# Patient Record
Sex: Male | Born: 1990 | Race: Black or African American | Hispanic: No | Marital: Single | Smoking: Current every day smoker
Health system: Southern US, Community
[De-identification: ages and names within clinical notes are randomized; demographics above are authoritative.]

---
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
## 2015-01-16 ENCOUNTER — Emergency Department (HOSPITAL_BASED_OUTPATIENT_CLINIC_OR_DEPARTMENT_OTHER)
Admission: EM | Admit: 2015-01-16 | Discharge: 2015-01-16 | Disposition: A | Discharge status: Home / Self Care | Payer: Self-pay | Attending: Emergency Medicine | Admitting: Emergency Medicine

## 2015-01-16 ENCOUNTER — Encounter (HOSPITAL_BASED_OUTPATIENT_CLINIC_OR_DEPARTMENT_OTHER): Payer: Self-pay | Admitting: *Deleted

## 2015-01-16 DIAGNOSIS — Z202 Contact with and (suspected) exposure to infections with a predominantly sexual mode of transmission: Secondary | ICD-10-CM | POA: Insufficient documentation

## 2015-01-16 DIAGNOSIS — R1030 Lower abdominal pain, unspecified: Secondary | ICD-10-CM

## 2015-01-16 DIAGNOSIS — Z711 Person with feared health complaint in whom no diagnosis is made: Secondary | ICD-10-CM

## 2015-01-16 DIAGNOSIS — R112 Nausea with vomiting, unspecified: Secondary | ICD-10-CM | POA: Insufficient documentation

## 2015-01-16 DIAGNOSIS — Z720 Tobacco use: Secondary | ICD-10-CM | POA: Insufficient documentation

## 2015-01-16 DIAGNOSIS — R300 Dysuria: Secondary | ICD-10-CM | POA: Insufficient documentation

## 2015-01-16 LAB — URINALYSIS, ROUTINE W REFLEX MICROSCOPIC
BILIRUBIN URINE: NEGATIVE
GLUCOSE, UA: NEGATIVE mg/dL
Hgb urine dipstick: NEGATIVE
KETONES UR: NEGATIVE mg/dL
LEUKOCYTES UA: NEGATIVE
Nitrite: NEGATIVE
PH: 6 (ref 5.0–8.0)
PROTEIN: NEGATIVE mg/dL
Specific Gravity, Urine: 1.022 (ref 1.005–1.030)
UROBILINOGEN UA: 0.2 mg/dL (ref 0.0–1.0)

## 2015-01-16 MED ORDER — CEFTRIAXONE SODIUM 250 MG IJ SOLR
250.0000 mg | Freq: Once | INTRAMUSCULAR | Status: AC
  Administered 2015-01-16: 250 mg via INTRAMUSCULAR
  Filled 2015-01-16: qty 250

## 2015-01-16 MED ORDER — AZITHROMYCIN 250 MG PO TABS
1000.0000 mg | ORAL_TABLET | Freq: Once | ORAL | Status: AC
  Administered 2015-01-16: 1000 mg via ORAL
  Filled 2015-01-16: qty 4

## 2015-01-16 NOTE — Discharge Instructions (Signed)
You were treated today for both gonorrhea and chlamydia. If these tests result positive, you will be contacted and are then obligated to inform your partner for treatment.  Abdominal Pain Many things can cause abdominal pain. Usually, abdominal pain is not caused by a disease and will improve without treatment. It can often be observed and treated at home. Your health care provider will do a physical exam and possibly order blood tests and X-rays to help determine the seriousness of your pain. However, in many cases, more time must pass before a clear cause of the pain can be found. Before that point, your health care provider may not know if you need more testing or further treatment. HOME CARE INSTRUCTIONS  Monitor your abdominal pain for any changes. The following actions may help to alleviate any discomfort you are experiencing:  Only take over-the-counter or prescription medicines as directed by your health care provider.  Do not take laxatives unless directed to do so by your health care provider.  Try a clear liquid diet (broth, tea, or water) as directed by your health care provider. Slowly move to a bland diet as tolerated. SEEK MEDICAL CARE IF:  You have unexplained abdominal pain.  You have abdominal pain associated with nausea or diarrhea.  You have pain when you urinate or have a bowel movement.  You experience abdominal pain that wakes you in the night.  You have abdominal pain that is worsened or improved by eating food.  You have abdominal pain that is worsened with eating fatty foods.  You have a fever. SEEK IMMEDIATE MEDICAL CARE IF:   Your pain does not go away within 2 hours.  You keep throwing up (vomiting).  Your pain is felt only in portions of the abdomen, such as the right side or the left lower portion of the abdomen.  You pass bloody or black tarry stools. MAKE SURE YOU:  Understand these instructions.   Will watch your condition.   Will get help  right away if you are not doing well or get worse.  Document Released: 03/20/2005 Document Revised: 06/15/2013 Document Reviewed: 02/17/2013 [facility] Patient Information 2015 [place], [place]. This information is not intended to replace advice given to you by your health care provider. Make sure you discuss any questions you have with your health care provider.  Dysuria Dysuria is the medical term for pain with urination. There are many causes for dysuria, but urinary tract infection is the most common. If a urinalysis was performed it can show that there is a urinary tract infection. A urine culture confirms that you or your child is sick. You will need to follow up with a healthcare provider because:  If a urine culture was done you will need to know the culture results and treatment recommendations.  If the urine culture was positive, you or your child will need to be put on antibiotics or know if the antibiotics prescribed are the right antibiotics for your urinary tract infection.  If the urine culture is negative (no urinary tract infection), then other causes may need to be explored or antibiotics need to be stopped. Today laboratory work may have been done and there does not seem to be an infection. If cultures were done they will take at least 24 to 48 hours to be completed. Today x-rays may have been taken and they read as normal. No cause can be found for the problems. The x-rays may be re-read by a radiologist and you will be  contacted if additional findings are made. You or your child may have been put on medications to help with this problem until you can see your primary caregiver. If the problems get better, see your primary caregiver if the problems return. If you were given antibiotics (medications which kill germs), take all of the mediations as directed for the full course of treatment.  If laboratory work was done, you need to find the results. Leave a telephone number where you  can be reached. If this is not possible, make sure you find out how you are to get test results. HOME CARE INSTRUCTIONS   Drink lots of fluids. For adults, drink eight, 8 ounce glasses of clear juice or water a day. For children, replace fluids as suggested by your caregiver.  Empty the bladder often. Avoid holding urine for long periods of time.  After a bowel movement, women should cleanse front to back, using each tissue only once.  Empty your bladder before and after sexual intercourse.  Take all the medicine given to you until it is gone. You may feel better in a few days, but TAKE ALL MEDICINE.  Avoid caffeine, tea, alcohol and carbonated beverages, because they tend to irritate the bladder.  In men, alcohol may irritate the prostate.  Only take over-the-counter or prescription medicines for pain, discomfort, or fever as directed by your caregiver.  If your caregiver has given you a follow-up appointment, it is very important to keep that appointment. Not keeping the appointment could result in a chronic or permanent injury, pain, and disability. If there is any problem keeping the appointment, you must call back to this facility for assistance. SEEK IMMEDIATE MEDICAL CARE IF:   Back pain develops.  A fever develops.  There is nausea (feeling sick to your stomach) or vomiting (throwing up).  Problems are no better with medications or are getting worse. MAKE SURE YOU:   Understand these instructions.  Will watch your condition.  Will get help right away if you are not doing well or get worse. Document Released: 03/08/2004 Document Revised: 09/02/2011 Document Reviewed: 01/14/2008 [facility] Patient Information 2015 [place], [place]. This information is not intended to replace advice given to you by your health care provider. Make sure you discuss any questions you have with your health care provider. Sexually Transmitted Disease A sexually transmitted disease (STD) is a  disease or infection that may be passed (transmitted) from person to person, usually during sexual activity. This may happen by way of saliva, semen, blood, vaginal mucus, or urine. Common STDs include:   Gonorrhea.   Chlamydia.   Syphilis.   HIV and AIDS.   Genital herpes.   Hepatitis B and C.   Trichomonas.   Human papillomavirus (HPV).   Pubic lice.   Scabies.  Mites.  Bacterial vaginosis. WHAT ARE CAUSES OF STDs? An STD may be caused by bacteria, a virus, or parasites. STDs are often transmitted during sexual activity if one person is infected. However, they may also be transmitted through nonsexual means. STDs may be transmitted after:   Sexual intercourse with an infected person.   Sharing sex toys with an infected person.   Sharing needles with an infected person or using unclean piercing or tattoo needles.  Having intimate contact with the genitals, mouth, or rectal areas of an infected person.   Exposure to infected fluids during birth. WHAT ARE THE SIGNS AND SYMPTOMS OF STDs? Different STDs have different symptoms. Some people may not have any symptoms.  If symptoms are present, they may include:   Painful or bloody urination.   Pain in the pelvis, abdomen, vagina, anus, throat, or eyes.   A skin rash, itching, or irritation.  Growths, ulcerations, blisters, or sores in the genital and anal areas.  Abnormal vaginal discharge with or without bad odor.   Penile discharge in men.   Fever.   Pain or bleeding during sexual intercourse.   Swollen glands in the groin area.   Yellow skin and eyes (jaundice). This is seen with hepatitis.   Swollen testicles.  Infertility.  Sores and blisters in the mouth. HOW ARE STDs DIAGNOSED? To make a diagnosis, your health care provider may:   Take a medical history.   Perform a physical exam.   Take a sample of any discharge to examine.  Swab the throat, cervix, opening to the  penis, rectum, or vagina for testing.  Test a sample of your first morning urine.   Perform blood tests.   Perform a Pap test, if this applies.   Perform a colposcopy.   Perform a laparoscopy.  HOW ARE STDs TREATED? Treatment depends on the STD. Some STDs may be treated but not cured.   Chlamydia, gonorrhea, trichomonas, and syphilis can be cured with antibiotic medicine.   Genital herpes, hepatitis, and HIV can be treated, but not cured, with prescribed medicines. The medicines lessen symptoms.   Genital warts from HPV can be treated with medicine or by freezing, burning (electrocautery), or surgery. Warts may come back.   HPV cannot be cured with medicine or surgery. However, abnormal areas may be removed from the cervix, vagina, or vulva.   If your diagnosis is confirmed, your recent sexual partners need treatment. This is true even if they are symptom-free or have a negative culture or evaluation. They should not have sex until their health care providers say it is okay. HOW CAN I REDUCE MY RISK OF GETTING AN STD? Take these steps to reduce your risk of getting an STD:  Use latex condoms, dental dams, and water-soluble lubricants during sexual activity. Do not use petroleum jelly or oils.  Avoid having multiple sex partners.  Do not have sex with someone who has other sex partners.  Do not have sex with anyone you do not know or who is at high risk for an STD.  Avoid risky sex practices that can break your skin.  Do not have sex if you have open sores on your mouth or skin.  Avoid drinking too much alcohol or taking illegal drugs. Alcohol and drugs can affect your judgment and put you in a vulnerable position.  Avoid engaging in oral and anal sex acts.  Get vaccinated for HPV and hepatitis. If you have not received these vaccines in the past, talk to your health care provider about whether one or both might be right for you.   If you are at risk of being  infected with HIV, it is recommended that you take a prescription medicine daily to prevent HIV infection. This is called pre-exposure prophylaxis (PrEP). You are considered at risk if:  You are a man who has sex with other men (MSM).  You are a heterosexual man or woman and are sexually active with more than one partner.  You take drugs by injection.  You are sexually active with a partner who has HIV.  Talk with your health care provider about whether you are at high risk of being infected with HIV. If you choose  to begin PrEP, you should first be tested for HIV. You should then be tested every 3 months for as long as you are taking PrEP.  WHAT SHOULD I DO IF I THINK I HAVE AN STD?  See your health care provider.   Tell your sexual partner(s). They should be tested and treated for any STDs.  Do not have sex until your health care provider says it is okay. WHEN SHOULD I GET IMMEDIATE MEDICAL CARE? Contact your health care provider right away if:   You have severe abdominal pain.  You are a man and notice swelling or pain in your testicles.  You are a woman and notice swelling or pain in your vagina. Document Released: 08/31/2002 Document Revised: 06/15/2013 Document Reviewed: 12/29/2012 [facility] Patient Information 2015 [place], [place]. This information is not intended to replace advice given to you by your health care provider. Make sure you discuss any questions you have with your health care provider.

## 2015-01-16 NOTE — ED Notes (Signed)
Not in ED WR when called for tx area 

## 2015-01-16 NOTE — ED Provider Notes (Signed)
CSN: [id number]     Arrival date & time 01/16/15  1531 History  This chart was scribed for non-physician practitioner, Kathrynn Speed, PA-C working with Blake Divine, MD by Placido Sou, ED scribe. This patient was seen in room MH01/MH01 and the patient's care was started at 6:21 PM.  Chief Complaint  Patient presents with  . Abdominal Pain   The history is provided by the patient. No language interpreter was used.    HPI Comments: Robert Morton is a 24 y.o. male who presents to the Emergency Department complaining of constant, moderate, lower abd pain with onset 6 days ago. He notes a worsening of his abd pain s/p urinating, describes the pain as sharp, notes associated dysuria and nausea and further notes nothing alleviates his pain. Pt notes consuming 3-4 alcoholic beverages per day and further notes having a new sexual partner about 6 days ago and using protection during intercourse. Pt denies recent travel, dietary changes, fever, chills, penile discharge and vomiting. He is concerned for STDs. No diarrhea or constipation.  History reviewed. No pertinent past medical history. History reviewed. No pertinent past surgical history. No family history on file. History  Substance Use Topics  . Smoking status: Current Every Day Smoker  . Smokeless tobacco: Not on file  . Alcohol Use: Yes    Review of Systems  Constitutional: Negative for fever and chills.  Gastrointestinal: Positive for nausea and abdominal pain. Negative for vomiting.  Genitourinary: Positive for dysuria. Negative for discharge.  All other systems reviewed and are negative.   Allergies  Review of patient's allergies indicates no known allergies.  Home Medications   Prior to Admission medications   Not on File   BP 113/72 mmHg  Pulse 74  Temp(Src) 98 F (36.7 C) (Oral)  Resp 18  Ht 6' (1.829 m)  Wt 136 lb (61.689 kg)  BMI 18.44 kg/m2  SpO2 100% Physical Exam  Constitutional: He is oriented to person,  place, and time. He appears well-developed and well-nourished. No distress.  HENT:  Head: Normocephalic and atraumatic.  Eyes: Conjunctivae and EOM are normal.  Neck: Normal range of motion. Neck supple.  Cardiovascular: Normal rate, regular rhythm and normal heart sounds.   Pulmonary/Chest: Effort normal and breath sounds normal.  Abdominal: Soft. Bowel sounds are normal. He exhibits no distension. There is no tenderness.  No CVAT.  Genitourinary: Right testis shows no mass, no swelling and no tenderness. Cremasteric reflex is not absent on the right side. Left testis shows no mass, no swelling and no tenderness. Cremasteric reflex is not absent on the left side. Circumcised. No penile erythema or penile tenderness. No discharge found.  Musculoskeletal: Normal range of motion. He exhibits no edema.  Neurological: He is alert and oriented to person, place, and time.  Skin: Skin is warm and dry.  Psychiatric: He has a normal mood and affect. His behavior is normal.  Nursing note and vitals reviewed.   ED Course  Procedures  DIAGNOSTIC STUDIES: Oxygen Saturation is 100% on RA, normal by my interpretation.    COORDINATION OF CARE: 6:27 PM Discussed treatment plan with pt at bedside 100 and pt agreed to plan.  Labs Review Labs Reviewed  URINALYSIS, ROUTINE W REFLEX MICROSCOPIC (NOT AT [facility])  GC/CHLAMYDIA PROBE AMP ([place]) NOT AT [facility]    Imaging Review No results found.   EKG Interpretation None     MDM   Final diagnoses:  Dysuria  Concern about STD in male without diagnosis  Lower abdominal pain   Nontoxic appearing, NAD. AF VSS. Abdomen soft and nontender. No CVA tenderness. UA negative. GC/Chlamydia cultures pending. Prophylactically treated with Rocephin and azithromycin. States sexual practices discussed. Has nausea without vomiting. Tolerating PO. Stable for discharge. Return precautions given. Patient states understanding of treatment care plan and is  agreeable.  I personally performed the services described in this documentation, which was scribed in my presence. The recorded information has been reviewed and is accurate.  Kathrynn Speed, PA-C 01/16/15 1845  Blake Divine, MD 01/18/15 [phone]

## 2015-01-16 NOTE — ED Notes (Signed)
Pt returned to ED WR and now is not in ED WR again when called for tx area

## 2015-01-16 NOTE — ED Notes (Signed)
pa at bedside. 

## 2015-01-16 NOTE — ED Notes (Signed)
Pt c/o lower abd pressure and burning with urination that began 5 days ago after having unprotected sex.

## 2015-01-17 LAB — GC/CHLAMYDIA PROBE AMP (~~LOC~~) NOT AT ARMC
Chlamydia: NEGATIVE
Neisseria Gonorrhea: NEGATIVE

## 2015-04-10 ENCOUNTER — Encounter (HOSPITAL_BASED_OUTPATIENT_CLINIC_OR_DEPARTMENT_OTHER): Payer: Self-pay | Admitting: *Deleted

## 2015-04-10 ENCOUNTER — Emergency Department (HOSPITAL_BASED_OUTPATIENT_CLINIC_OR_DEPARTMENT_OTHER)
Admission: EM | Admit: 2015-04-10 | Discharge: 2015-04-10 | Disposition: A | Discharge status: Home / Self Care | Payer: Self-pay | Attending: Emergency Medicine | Admitting: Emergency Medicine

## 2015-04-10 DIAGNOSIS — A64 Unspecified sexually transmitted disease: Secondary | ICD-10-CM | POA: Insufficient documentation

## 2015-04-10 DIAGNOSIS — Z720 Tobacco use: Secondary | ICD-10-CM | POA: Insufficient documentation

## 2015-04-10 MED ORDER — AZITHROMYCIN 250 MG PO TABS
1000.0000 mg | ORAL_TABLET | Freq: Once | ORAL | Status: AC
  Administered 2015-04-10: 1000 mg via ORAL
  Filled 2015-04-10: qty 4

## 2015-04-10 MED ORDER — CEFTRIAXONE SODIUM 250 MG IJ SOLR
250.0000 mg | Freq: Once | INTRAMUSCULAR | Status: AC
  Administered 2015-04-10: 250 mg via INTRAMUSCULAR
  Filled 2015-04-10: qty 250

## 2015-04-10 NOTE — Discharge Instructions (Signed)
Sexually Transmitted Disease °A sexually transmitted disease (STD) is a disease or infection that may be passed (transmitted) from person to person, usually during sexual activity. This may happen by way of saliva, semen, blood, vaginal mucus, or urine. Common STDs include: °· Gonorrhea. °· Chlamydia. °· Syphilis. °· HIV and AIDS. °· Genital herpes. °· Hepatitis B and C. °· Trichomonas. °· Human papillomavirus (HPV). °· Pubic lice. °· Scabies. °· Mites. °· Bacterial vaginosis. °WHAT ARE CAUSES OF STDs? °An STD may be caused by bacteria, a virus, or parasites. STDs are often transmitted during sexual activity if one person is infected. However, they may also be transmitted through nonsexual means. STDs may be transmitted after:  °· Sexual intercourse with an infected person. °· Sharing sex toys with an infected person. °· Sharing needles with an infected person or using unclean piercing or tattoo needles. °· Having intimate contact with the genitals, mouth, or rectal areas of an infected person. °· Exposure to infected fluids during birth. °WHAT ARE THE SIGNS AND SYMPTOMS OF STDs? °Different STDs have different symptoms. Some people may not have any symptoms. If symptoms are present, they may include: °· Painful or bloody urination. °· Pain in the pelvis, abdomen, vagina, anus, throat, or eyes. °· A skin rash, itching, or irritation. °· Growths, ulcerations, blisters, or sores in the genital and anal areas. °· Abnormal vaginal discharge with or without bad odor. °· Penile discharge in men. °· Fever. °· Pain or bleeding during sexual intercourse. °· Swollen glands in the groin area. °· Yellow skin and eyes (jaundice). This is seen with hepatitis. °· Swollen testicles. °· Infertility. °· Sores and blisters in the mouth. °HOW ARE STDs DIAGNOSED? °To make a diagnosis, your health care provider may: °· Take a medical history. °· Perform a physical exam. °· Take a sample of any discharge to examine. °· Swab the throat,  cervix, opening to the penis, rectum, or vagina for testing. °· Test a sample of your first morning urine. °· Perform blood tests. °· Perform a Pap test, if this applies. °· Perform a colposcopy. °· Perform a laparoscopy. °HOW ARE STDs TREATED? °Treatment depends on the STD. Some STDs may be treated but not cured. °· Chlamydia, gonorrhea, trichomonas, and syphilis can be cured with antibiotic medicine. °· Genital herpes, hepatitis, and HIV can be treated, but not cured, with prescribed medicines. The medicines lessen symptoms. °· Genital warts from HPV can be treated with medicine or by freezing, burning (electrocautery), or surgery. Warts may come back. °· HPV cannot be cured with medicine or surgery. However, abnormal areas may be removed from the cervix, vagina, or vulva. °· If your diagnosis is confirmed, your recent sexual partners need treatment. This is true even if they are symptom-free or have a negative culture or evaluation. They should not have sex until their health care providers say it is okay. °· Your health care provider may test you for infection again 3 months after treatment. °HOW CAN I REDUCE MY RISK OF GETTING AN STD? °Take these steps to reduce your risk of getting an STD: °· Use latex condoms, dental dams, and water-soluble lubricants during sexual activity. Do not use petroleum jelly or oils. °· Avoid having multiple sex partners. °· Do not have sex with someone who has other sex partners °· Do not have sex with anyone you do not know or who is at high risk for an STD. °· Avoid risky sex practices that can break your skin. °· Do not have sex   if you have open sores on your mouth or skin. °· Avoid drinking too much alcohol or taking illegal drugs. Alcohol and drugs can affect your judgment and put you in a vulnerable position. °· Avoid engaging in oral and anal sex acts. °· Get vaccinated for HPV and hepatitis. If you have not received these vaccines in the past, talk to your health care  provider about whether one or both might be right for you. °· If you are at risk of being infected with HIV, it is recommended that you take a prescription medicine daily to prevent HIV infection. This is called pre-exposure prophylaxis (PrEP). You are considered at risk if: °¨ You are a man who has sex with other men (MSM). °¨ You are a heterosexual man or woman and are sexually active with more than one partner. °¨ You take drugs by injection. °¨ You are sexually active with a partner who has HIV. °· Talk with your health care provider about whether you are at high risk of being infected with HIV. If you choose to begin PrEP, you should first be tested for HIV. You should then be tested every 3 months for as long as you are taking PrEP. °WHAT SHOULD I DO IF I THINK I HAVE AN STD? °· See your health care provider. °· Tell your sexual partner(s). They should be tested and treated for any STDs. °· Do not have sex until your health care provider says it is okay. °WHEN SHOULD I GET IMMEDIATE MEDICAL CARE? °Contact your health care provider right away if:  °· You have severe abdominal pain. °· You are a man and notice swelling or pain in your testicles. °· You are a woman and notice swelling or pain in your vagina. °  °This information is not intended to replace advice given to you by your health care provider. Make sure you discuss any questions you have with your health care provider. °  °Document Released: 08/31/2002 Document Revised: 07/01/2014 Document Reviewed: 12/29/2012 °[employer] Interactive Patient Education ©2016 [employer] ° °

## 2015-04-10 NOTE — ED Notes (Signed)
Pt reports having unprotected sex, and since then has had itching and pain to his penis. Denies any discharge or drainage.

## 2015-04-10 NOTE — ED Provider Notes (Signed)
CSN: [id number]     Arrival date & time 04/10/15  0804 History   First MD Initiated Contact with Patient 04/10/15 [phone]     Chief Complaint  Patient presents with  . Exposure to STD     (Consider location/radiation/quality/duration/timing/severity/associated sxs/prior Treatment) Patient is a 24 y.o. male presenting with STD exposure. The history is provided by the patient.  Exposure to STD This is a new problem. Episode onset: 3-5 days ago. The problem occurs constantly. The problem has been gradually worsening. Associated symptoms comments: Burning to the end of penis and when starting urine stream.  No lesions or discharge.  5 sexual partners in the last month.  All women.  No anal sex and no protection used for any. Exacerbated by: urinating. Nothing relieves the symptoms. He has tried nothing for the symptoms. The treatment provided no relief.    History reviewed. No pertinent past medical history. History reviewed. No pertinent past surgical history. History reviewed. No pertinent family history. Social History  Substance Use Topics  . Smoking status: Current Every Day Smoker  . Smokeless tobacco: None  . Alcohol Use: Yes    Review of Systems  All other systems reviewed and are negative.     Allergies  Review of patient's allergies indicates no known allergies.  Home Medications   Prior to Admission medications   Not on File   BP 107/76 mmHg  Pulse 87  Temp(Src) 97.7 F (36.5 C) (Oral)  Resp 16  Ht 5\' 11"  (1.803 m)  Wt 140 lb (63.504 kg)  BMI 19.53 kg/m2  SpO2 100% Physical Exam  Constitutional: He is oriented to person, place, and time. He appears well-developed and well-nourished. No distress.  HENT:  Head: Normocephalic and atraumatic.  Eyes: EOM are normal. Pupils are equal, round, and reactive to light.  Cardiovascular: Normal rate.   Pulmonary/Chest: Effort normal.  Genitourinary: Penis normal.  No lesions or urethral discharge  Neurological: He is  alert and oriented to person, place, and time.  Skin: Skin is warm and dry.  Psychiatric: He has a normal mood and affect. His behavior is normal.  Nursing note and vitals reviewed.   ED Course  Procedures (including critical care time) Labs Review Labs Reviewed  RPR  HIV ANTIBODY (ROUTINE TESTING)  GC/CHLAMYDIA PROBE AMP ([place]    Imaging Review No results found. I have personally reviewed and evaluated these images and lab results as part of my medical decision-making.   EKG Interpretation None      MDM   Final diagnoses:  STD (male)    Patient presenting today with burning and tingling at the end of his penis and mild pain when starting his urinary stream. He does not use any protection and has had 5 sexual partners in the last 1 month. He denies any wound drainage but states approximate 2 months ago he was treated for gonorrhea and Chlamydia he does not know what he had his symptoms resolved after treatment. Counseled patient in using safe sex practices as well as check HIV, RPR indicated new GC chlamydia swab. Patient was given Rocephin and azithromycin    Gwyneth SproutWhitney Danae Oland, MD 04/10/15 1017

## 2015-04-11 LAB — GC/CHLAMYDIA PROBE AMP (~~LOC~~) NOT AT ARMC
Chlamydia: NEGATIVE
Neisseria Gonorrhea: NEGATIVE

## 2015-04-11 LAB — RPR: RPR Ser Ql: NONREACTIVE

## 2015-04-11 LAB — HIV ANTIBODY (ROUTINE TESTING W REFLEX): HIV Screen 4th Generation wRfx: NONREACTIVE

## 2018-04-23 ENCOUNTER — Emergency Department (HOSPITAL_BASED_OUTPATIENT_CLINIC_OR_DEPARTMENT_OTHER)
Admission: EM | Admit: 2018-04-23 | Discharge: 2018-04-23 | Disposition: A | Discharge status: Home / Self Care | Payer: Self-pay | Attending: Emergency Medicine | Admitting: Emergency Medicine

## 2018-04-23 ENCOUNTER — Encounter (HOSPITAL_BASED_OUTPATIENT_CLINIC_OR_DEPARTMENT_OTHER): Payer: Self-pay | Admitting: Emergency Medicine

## 2018-04-23 ENCOUNTER — Other Ambulatory Visit: Payer: Self-pay

## 2018-04-23 DIAGNOSIS — R112 Nausea with vomiting, unspecified: Secondary | ICD-10-CM | POA: Insufficient documentation

## 2018-04-23 DIAGNOSIS — F17200 Nicotine dependence, unspecified, uncomplicated: Secondary | ICD-10-CM | POA: Insufficient documentation

## 2018-04-23 MED ORDER — ONDANSETRON 4 MG PO TBDP
4.0000 mg | ORAL_TABLET | Freq: Once | ORAL | Status: AC
  Administered 2018-04-23: 4 mg via ORAL
  Filled 2018-04-23: qty 1

## 2018-04-23 MED ORDER — ONDANSETRON 4 MG PO TBDP: 4.0000 mg | ORAL_TABLET | Freq: Four times a day (QID) | ORAL | 0 refills | Status: AC | PRN

## 2018-04-23 NOTE — ED Provider Notes (Signed)
TIME SEEN: 4:19 AM  CHIEF COMPLAINT: Vomiting  HPI: Patient is a 27 year old male with no significant past medical history who presents to the emergency department with vomiting.  States he vomited several times about 20 minutes prior to arrival.  He was at work when this happened.  States his boss sent him here to "get checked out and make sure I will have the flu or something".  No fever.  No cough, congestion, sore throat.  No diarrhea.  No abdominal pain.  No recent travel or sick contacts.  He works in [employer].  ROS: See HPI Constitutional: no fever  Eyes: no drainage  ENT: no runny nose   Cardiovascular:  no chest pain  Resp: no SOB  GI:  vomiting GU: no dysuria Integumentary: no rash  Allergy: no hives  Musculoskeletal: no leg swelling  Neurological: no slurred speech ROS otherwise negative  PAST MEDICAL HISTORY/PAST SURGICAL HISTORY:  History reviewed. No pertinent past medical history.  MEDICATIONS:  Prior to Admission medications   Not on File    ALLERGIES:  No Known Allergies  SOCIAL HISTORY:  Social History   Tobacco Use  . Smoking status: Current Every Day Smoker  Substance Use Topics  . Alcohol use: Yes    FAMILY HISTORY: No family history on file.  EXAM: BP 129/77 (BP Location: Right Arm)   Pulse 81   Temp 97.6 F (36.4 C) (Oral)   Resp 16   Ht 5\' 11"  (1.803 m)   Wt 61.2 kg   SpO2 99%   BMI 18.83 kg/m  CONSTITUTIONAL: Alert and oriented and responds appropriately to questions. Well-appearing; well-nourished HEAD: Normocephalic EYES: Conjunctivae clear, pupils appear equal, EOMI ENT: normal nose; moist mucous membranes NECK: Supple, no meningismus, no nuchal rigidity, no LAD  CARD: RRR; S1 and S2 appreciated; no murmurs, no clicks, no rubs, no gallops RESP: Normal chest excursion without splinting or tachypnea; breath sounds clear and equal bilaterally; no wheezes, no rhonchi, no rales, no hypoxia or respiratory distress, speaking  full sentences ABD/GI: Normal bowel sounds; non-distended; soft, non-tender, no rebound, no guarding, no peritoneal signs, no hepatosplenomegaly BACK:  The back appears normal and is non-tender to palpation, there is no CVA tenderness EXT: Normal ROM in all joints; non-tender to palpation; no edema; normal capillary refill; no cyanosis, no calf tenderness or swelling    SKIN: Normal color for age and race; warm; no rash NEURO: Moves all extremities equally PSYCH: The patient's mood and manner are appropriate. Grooming and personal hygiene are appropriate.  MEDICAL DECISION MAKING: Patient here with vomiting just prior to arrival.  Completely benign abdominal exam.  Does not appear dehydrated.  Normal vital signs.  Doubt appendicitis, colitis, cholecystitis, pancreatitis, bowel obstruction, colitis or other life-threatening illness at this time.  Discussed with patient that this may be viral illness, food poisoning.  Recommended supportive treatment including increase oral fluids, bland diet.  Will discharge with prescription of Zofran.  At this time, I do not feel there is any life-threatening condition present. I have reviewed and discussed all results (EKG, imaging, lab, urine as appropriate) and exam findings with patient/family. I have reviewed nursing notes and appropriate previous records.  I feel the patient is safe to be discharged home without further emergent workup and can continue workup as an outpatient as needed. Discussed usual and customary return precautions. Patient/family verbalize understanding and are comfortable with this plan.  Outpatient follow-up has been provided if needed. All questions have been answered.  Ward, Layla Maw, DO 04/23/18 [phone]

## 2018-04-23 NOTE — ED Triage Notes (Signed)
Pt reports onset of vomiting 20 min PTA. Denies diarrhea.

## 2018-04-23 NOTE — Discharge Instructions (Signed)
To find a primary care or specialty doctor please call [phone] or [phone] to access "[facility] Find a Doctor Service." ° °You may also go on the [facility] website at [email or web address] ° °There are also multiple [facility], [facility] and [facility] practices throughout [place] that are frequently accepting new patients. You may find a clinic that is close to your home and contact them. ° ° [facility] -  °[address] °[place] [address] °[phone] ° ° °[facility] -  °[address] °[place] [address] °[phone] ° ° °[facility] - °[address]  °[place] [address] °[phone] ° ° °
